# Patient Record
Sex: Female | Born: 2005
Health system: Southern US, Community
[De-identification: ages and names within clinical notes are randomized; demographics above are authoritative.]

## PROBLEM LIST (undated history)

## (undated) DIAGNOSIS — T7840XA Allergy, unspecified, initial encounter: Secondary | ICD-10-CM

## (undated) HISTORY — DX: Allergy, unspecified, initial encounter: T78.40XA

---
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
## 2024-01-01 ENCOUNTER — Encounter: Payer: Self-pay | Admitting: Otolaryngology

## 2024-01-01 DIAGNOSIS — E041 Nontoxic single thyroid nodule: Secondary | ICD-10-CM

## 2024-02-12 ENCOUNTER — Telehealth: Payer: Self-pay | Admitting: *Deleted

## 2024-02-12 ENCOUNTER — Encounter: Payer: Self-pay | Admitting: Obstetrics and Gynecology

## 2024-02-12 ENCOUNTER — Ambulatory Visit: Admitting: Obstetrics and Gynecology

## 2024-02-12 VITALS — BP 118/72 | HR 73 | Temp 98.2°F | Wt 200.0 lb

## 2024-02-12 DIAGNOSIS — B9689 Other specified bacterial agents as the cause of diseases classified elsewhere: Secondary | ICD-10-CM

## 2024-02-12 DIAGNOSIS — N760 Acute vaginitis: Secondary | ICD-10-CM

## 2024-02-12 DIAGNOSIS — B3731 Acute candidiasis of vulva and vagina: Secondary | ICD-10-CM | POA: Diagnosis not present

## 2024-02-12 DIAGNOSIS — N898 Other specified noninflammatory disorders of vagina: Secondary | ICD-10-CM

## 2024-02-12 LAB — WET PREP FOR TRICH, YEAST, CLUE

## 2024-02-12 MED ORDER — FLUCONAZOLE 150 MG PO TABS: 150.0000 mg | ORAL_TABLET | Freq: Once | ORAL | 0 refills | Status: AC

## 2024-02-12 MED ORDER — METRONIDAZOLE 0.75 % VA GEL: 1.0000 | Freq: Every day | VAGINAL | 0 refills | Status: AC

## 2024-02-12 NOTE — Progress Notes (Signed)
 18 y.o. G0P0000 female here for problem visit. Single.  Patient's last menstrual period was 01/21/2024 (exact date). Period Duration (Days): 4 Period Pattern: Regular Menstrual Flow: Moderate Menstrual Control: Maxi pad Dysmenorrhea: (!) Moderate  She reports vaginal itching x 1 week, has not used anything for symptoms. Vaginal odor since a month after inserting the nuvaring in July 2025. Using dove and method body wash  Planning to remove nuvaring Wednesday  Birth control: nuvaring Sexually active: yes, using condoms    GYN HISTORY: No sig hx  OB History  Gravida Para Term Preterm AB Living  0 0 0 0 0 0  SAB IAB Ectopic Multiple Live Births  0 0 0 0 0   Past Medical History:  Diagnosis Date   Allergy    Asthma    episodes of persistent cough, one episode of overt wheezing   Prematurity    33 weeks   Past Surgical History:  Procedure Laterality Date   WISDOM TOOTH EXTRACTION  09/2023   Current Outpatient Medications on File Prior to Visit  Medication Sig Dispense Refill   albuterol  (PROVENTIL ) (2.5 MG/3ML) 0.083% nebulizer solution Take 3 mLs (2.5 mg total) by nebulization every 6 (six) hours as needed for wheezing or shortness of breath. 75 mL 3   etonogestrel -ethinyl estradiol  (NUVARING) 0.12-0.015 MG/24HR vaginal ring Place 1 each vaginally every 28 (twenty-eight) days. Insert vaginally and leave in place for 3 consecutive weeks, then remove for 1 week. 3 each 4   Ferrous Sulfate (IRON PO) Take by mouth.     No current facility-administered medications on file prior to visit.   No Known Allergies    PE Today's Vitals   02/12/24 1336  BP: 118/72  Pulse: 73  Temp: 98.2 F (36.8 C)  TempSrc: Oral  SpO2: 97%  Weight: 200 lb (90.7 kg)   Body mass index is 31.32 kg/m.  Physical Exam Vitals reviewed. Exam conducted with a chaperone present.  Constitutional:      General: She is not in acute distress.    Appearance: Normal appearance.  HENT:      Head: Normocephalic and atraumatic.     Nose: Nose normal.  Eyes:     Extraocular Movements: Extraocular movements intact.     Conjunctiva/sclera: Conjunctivae normal.  Pulmonary:     Effort: Pulmonary effort is normal.  Genitourinary:    General: Normal vulva.     Exam position: Lithotomy position.     Vagina: Vaginal discharge present.     Cervix: Normal. No cervical motion tenderness, discharge or lesion.     Uterus: Normal. Not enlarged and not tender.      Adnexa: Right adnexa normal and left adnexa normal.  Musculoskeletal:        General: Normal range of motion.     Cervical back: Normal range of motion.  Neurological:     General: No focal deficit present.     Mental Status: She is alert.  Psychiatric:        Mood and Affect: Mood normal.        Behavior: Behavior normal.       Assessment and Plan:        Vaginal discharge -     WET PREP FOR TRICH, YEAST, CLUE  BV (bacterial vaginosis) -     metroNIDAZOLE; Place 1 Applicatorful vaginally at bedtime for 5 days.  Dispense: 50 g; Refill: 0  Yeast vaginitis -     Fluconazole; Take 1 tablet (150 mg total) by  mouth once for 1 dose.  Dispense: 1 tablet; Refill: 0    Vera LULLA Pa, MD

## 2024-02-12 NOTE — Patient Instructions (Signed)
 Avoid scented soaps, lotions, and menstrual products, perfumes. Also avoid douching and applying any products directly into the vagina.

## 2024-02-12 NOTE — Telephone Encounter (Signed)
 Patients mother left message on triage line requesting patient be called for yeast or reaction form nuvaring.   Spoke with patient. Started Nuvaring end of July, first of August. Places for 3 weeks, removes for 1 week. States symptoms started 1 month after starting Nuvaring. Reports internal vaginal itching, odor and white vaginal discharge. Describes odor as urine smell, stains clothing. Symptoms improve the week she removes the Nuvaring. Patient is sexually active, new partner, also uses condoms. Denies any urinary symptoms.   Front office already scheduled OV for today at 1330 with Dr. Dallie, instructed patient to keep OV as scheduled for evaluation. Patient agreeable.   Routing to provider for final review. Patient is agreeable to disposition. Will close encounter.

## 2024-02-26 ENCOUNTER — Encounter: Payer: Self-pay | Admitting: Obstetrics and Gynecology

## 2024-02-26 ENCOUNTER — Ambulatory Visit: Admitting: Obstetrics and Gynecology

## 2024-02-26 VITALS — BP 118/78 | HR 72 | Temp 97.8°F | Wt 200.0 lb

## 2024-02-26 DIAGNOSIS — Z3044 Encounter for surveillance of vaginal ring hormonal contraceptive device: Secondary | ICD-10-CM

## 2024-02-26 DIAGNOSIS — N898 Other specified noninflammatory disorders of vagina: Secondary | ICD-10-CM | POA: Diagnosis not present

## 2024-02-26 LAB — WET PREP FOR TRICH, YEAST, CLUE

## 2024-02-26 MED ORDER — ETONOGESTREL-ETHINYL ESTRADIOL 0.12-0.015 MG/24HR VA RING: VAGINAL_RING | VAGINAL | 12 refills | Status: AC

## 2024-02-26 NOTE — Progress Notes (Signed)
 18 y.o. G0P0000 female here for problem visit. Single. Presents with her mom. Sophomore at [employer].   Patient's last menstrual period was 02/18/2024 (exact date). Period Duration (Days): 3-5 Period Pattern: Regular Menstrual Flow: Moderate Menstrual Control: Maxi pad Dysmenorrhea: (!) Moderate  Vaginal odor since a month after inserting the nuvaring in July 2025. Using dove and method body wash. Vaginal odor goes away when she removes it. Some discharge. Itching resolved after treatment for yeast 2 wk ago.  Switched from nexplanon  to nuvaring due to wt gain.  Birth control: nuvaring Sexually active: yes, using condoms    GYN HISTORY: No sig hx  OB History  Gravida Para Term Preterm AB Living  0 0 0 0 0 0  SAB IAB Ectopic Multiple Live Births  0 0 0 0 0   Past Medical History:  Diagnosis Date   Allergy    Asthma    episodes of persistent cough, one episode of overt wheezing   Prematurity    33 weeks   Past Surgical History:  Procedure Laterality Date   WISDOM TOOTH EXTRACTION  09/2023   Current Outpatient Medications on File Prior to Visit  Medication Sig Dispense Refill   albuterol  (PROVENTIL ) (2.5 MG/3ML) 0.083% nebulizer solution Take 3 mLs (2.5 mg total) by nebulization every 6 (six) hours as needed for wheezing or shortness of breath. 75 mL 3   Ferrous Sulfate (IRON PO) Take by mouth.     No current facility-administered medications on file prior to visit.   No Known Allergies    PE Today's Vitals   02/26/24 1519  BP: 118/78  Pulse: 72  Temp: 97.8 F (36.6 C)  TempSrc: Oral  SpO2: 95%  Weight: 200 lb (90.7 kg)   Body mass index is 31.32 kg/m.  Physical Exam Vitals reviewed. Exam conducted with a chaperone present.  Constitutional:      General: She is not in acute distress.    Appearance: Normal appearance.  HENT:     Head: Normocephalic and atraumatic.     Nose: Nose normal.  Eyes:     Extraocular Movements: Extraocular movements  intact.     Conjunctiva/sclera: Conjunctivae normal.  Pulmonary:     Effort: Pulmonary effort is normal.  Genitourinary:    General: Normal vulva.     Exam position: Lithotomy position.     Vagina: Vaginal discharge present.     Cervix: Normal. No cervical motion tenderness, discharge or lesion.     Uterus: Normal. Not enlarged and not tender.      Adnexa: Right adnexa normal and left adnexa normal.  Musculoskeletal:        General: Normal range of motion.     Cervical back: Normal range of motion.  Neurological:     General: No focal deficit present.     Mental Status: She is alert.  Psychiatric:        Mood and Affect: Mood normal.        Behavior: Behavior normal.      Assessment and Plan:        Vaginal discharge -     WET PREP FOR TRICH, YEAST, CLUE  Encounter for surveillance of vaginal ring hormonal contraceptive device -     Etonogestrel -Ethinyl Estradiol ; Insert vaginally and leave in place for 3 consecutive weeks, then remove for 1 week.  Dispense: 1 each; Refill: 12  Consider 6 month trial of med to see if sx resolve vs alternative form of contraception She likes ring,  so she wants to continue. However, she wants to try alternative brand of vaginal contraceptive ring given recurrent BV symptoms.  Patient has also been treated for a yeast infection since starting NuvaRing.  Has also had side effects with nexplanon   Recommend boric acid on day she removed nuvaring   Vera LULLA Pa, MD

## 2024-03-15 ENCOUNTER — Encounter: Payer: Self-pay | Admitting: Obstetrics and Gynecology

## 2024-03-15 DIAGNOSIS — N760 Acute vaginitis: Secondary | ICD-10-CM

## 2024-03-15 DIAGNOSIS — B3731 Acute candidiasis of vulva and vagina: Secondary | ICD-10-CM

## 2024-03-15 MED ORDER — FLUCONAZOLE 150 MG PO TABS: 150.0000 mg | ORAL_TABLET | Freq: Once | ORAL | 1 refills | Status: AC

## 2024-03-15 MED ORDER — METRONIDAZOLE 0.75 % VA GEL: 1.0000 | Freq: Every day | VAGINAL | 0 refills | Status: AC

## 2024-03-15 NOTE — Telephone Encounter (Signed)
 Called and spoke with patient, she states that she is still having itching, irritation, and odor. Says that she still has rx of metronidazole  gel, wants to know if she should use that or if she needs to come in for a visit. Please advise.   Called and spoke with pharmacy, pharmacy states that she can pick up refill on Monday due to them currently being out of stock for nuvaring. Patient has been advised, will pick up Monday.

## 2024-04-17 ENCOUNTER — Telehealth: Payer: Self-pay
